# Patient Record
Sex: Male | Born: 2000 | Hispanic: Yes | Marital: Single | Smoking: Never smoker
Health system: Southern US, Community
[De-identification: ages and names within clinical notes are randomized; demographics above are authoritative.]

---
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
## 2022-07-14 ENCOUNTER — Ambulatory Visit (INDEPENDENT_AMBULATORY_CARE_PROVIDER_SITE_OTHER): Payer: Medicaid Other | Admitting: Pediatrics

## 2022-07-14 ENCOUNTER — Other Ambulatory Visit: Payer: Self-pay

## 2022-07-14 VITALS — HR 78 | Temp 98.2°F | Wt 209.8 lb

## 2022-07-14 DIAGNOSIS — J069 Acute upper respiratory infection, unspecified: Secondary | ICD-10-CM

## 2022-07-14 NOTE — Progress Notes (Addendum)
Subjective:    Matthew [name] is a 22 y.o. old male here by  himself for evaluation of 2 days of fever, cough, congestion, and sore throat.   Interpreter used during visit: No   HPI  Comes to clinic today for Cough (Cough, runny nose x 2 days.  Fever started Tuesday night, 102.5 this morning. ) .    Duration of chief complaint: 2 days  What have you tried? Tylenol  He reports that on Tuesday evening he started to have a cough and sore throat at home. He also had a fever that night. He felt somewhat better yesterday, but then felt worse again this morning and had a fever to 102F. He felt significantly better after taking some tylenol. He did not go to work today.  Works at [employer]. No known sick contacts. Did not get a Covid booster or annual flu shot this year.  No shortness of breath, chest pain, headache, ear pain, nausea, vomiting, diarrhea, rashes, or new medications. No recent abx.  Reports that he primarily came in because he had pneumonia in 2022 and wanted to make sure it was not happening again. He reports that he felt much more sick at that time.    Review of Systems  Constitutional:  Positive for chills and fever. Negative for activity change, appetite change and diaphoresis.  HENT:  Positive for congestion and sore throat. Negative for ear discharge, ear pain, hearing loss, mouth sores, sinus pressure, sinus pain and voice change.   Eyes:  Negative for pain, discharge and itching.  Respiratory:  Positive for cough. Negative for chest tightness, shortness of breath and wheezing.   Cardiovascular:  Negative for chest pain.  Gastrointestinal:  Negative for abdominal pain, constipation, diarrhea, nausea and vomiting.  Genitourinary:  Negative for difficulty urinating and dysuria.  Musculoskeletal:  Negative for back pain, myalgias, neck pain and neck stiffness.  Skin:  Negative for rash.  Neurological:  Negative for dizziness, syncope, numbness and headaches.   Psychiatric/Behavioral:  Negative for agitation, confusion and sleep disturbance.      History and Problem List: [place] has Overweight child on their problem list.  Jaleil  has no past medical history on file.      Objective:    Pulse 78   Temp 98.2 F (36.8 C) (Oral)   Wt 209 lb 12.8 oz (95.2 kg)   SpO2 100%   BMI 29.26 kg/m  Physical Exam Vitals and nursing note reviewed.  Constitutional:      General: He is not in acute distress.    Appearance: Normal appearance. He is not ill-appearing, toxic-appearing or diaphoretic.  HENT:     Head: Normocephalic and atraumatic.     Right Ear: Tympanic membrane, ear canal and external ear normal.     Left Ear: Tympanic membrane, ear canal and external ear normal.     Nose: Nose normal.     Mouth/Throat:     Mouth: Mucous membranes are moist.     Pharynx: Oropharynx is clear.  Eyes:     Extraocular Movements: Extraocular movements intact.     Conjunctiva/sclera: Conjunctivae normal.     Pupils: Pupils are equal, round, and reactive to light.  Cardiovascular:     Rate and Rhythm: Normal rate.     Pulses: Normal pulses.     Heart sounds: Normal heart sounds.  Pulmonary:     Effort: Pulmonary effort is normal.     Breath sounds: Normal breath sounds.  Abdominal:  General: Abdomen is flat. Bowel sounds are normal. There is no distension.     Palpations: Abdomen is soft.     Tenderness: There is no abdominal tenderness. There is no guarding or rebound.  Musculoskeletal:     Cervical back: Normal range of motion and neck supple.  Skin:    General: Skin is warm.     Capillary Refill: Capillary refill takes less than 2 seconds.     Findings: No rash.  Neurological:     General: No focal deficit present.     Mental Status: He is alert and oriented to person, place, and time.  Psychiatric:        Mood and Affect: Mood normal.        Behavior: Behavior normal.        Assessment and Plan:     Abid was seen today for  Cough (Cough, runny nose x 2 days.  Fever started Tuesday night, 102.5 this morning. ) .   Suspected Viral URI Presented with 2 days of fever, cough, congestion, and sore throat.  Very well appearing, afebrile, and had appropriate vitals for age in clinic. No signs or symptoms of localized bacterial infection. Physical exam reassuring with clear lungs, clear oropharynx, and benign abdomen. Appears well hydrated. Patient declined flu/Covid testing at this time. Suspect a non-specified viral URI as the most likely etiology of his symptoms.  -recommended supportive care and discussed return precautions -work note provided  -provided list of Adult Primary Care clinics in the area and encouraged transition to adult provider for next annual physical    Supportive care and return precautions reviewed.  Return if symptoms worsen or fail to improve.  Spent  20  minutes face to face time with patient; greater than 50% spent in counseling regarding diagnosis and treatment plan.  Hubbard Robinson, MD

## 2022-07-14 NOTE — Patient Instructions (Addendum)
It is likely that your symptoms are due to a viral upper respiratory infection. Your symptoms should improve on their own over the next few days--it is important to stay well hydrated during this time. You can use tylenol and/or ibuprofen to help with fevers, headaches, or other pains. You can also try some cough drops and warm salt water to gargle in the back of your throat and then spit out.   Please see a doctor if your fevers are still happening by Saturday/Sunday this weekend, if you start to have shortness of breath or chest pain, or start vomiting and are unable to eat or drink.   Below is a list of Adult Primary Care clinics in the area for you to look over as you are now at an age to transition to seeing an adult provider.   Adult Primary Care Clinics Name [facility]  Address: [place], [place] [address]  Phone: [phone] Hours: Monday - Friday 9 AM -6 PM  Types of insurance accepted:  Commercial insurance [employer] (orange card) [employer] Uninsured  Language services:  Video and phone interpreters available   Ages 58 and older    Adult primary care Onsite pharmacy Integrated behavioral health Financial assistance counseling Walk-in hours for established patients  Financial assistance counseling hours: Tuesdays 2:00PM - 5:00PM  Thursday 8:30AM - 4:30PM  Space is limited, 10 on Tuesday and 20 on Thursday. It's on first come first serve basis  Name [facility]  Address: [address] [place], [place] [address]  Phone: [phone]  Hours: Monday - Friday 8:30 AM - 5 PM  Types of insurance accepted:  Commercial insurance Medicaid Medicare Uninsured  Language services:  Video and phone interpreters available   All ages - newborn to adult   Primary care for all ages (children and adults) Integrated behavioral  health Nutritionist Financial assistance counseling   Name [facility] the ground floor of [facility]  Address: [address],  [place]  [address]  Phone: [phone]  Hours: Monday - Friday 8:15 AM - 5 PM  Types of insurance accepted:  Commercial insurance Medicaid Medicare Uninsured  Language services:  Video and phone interpreters available   Ages 11 and older   Adult primary care Nutritionist Certified Diabetes Educator  Integrated behavioral health Financial assistance counseling   Name [facility]  Address: [address] [place],  [address]  Phone: [phone]  Hours: Monday - Friday 8:30 AM - 5 PM    Types of insurance accepted:  Pharmacist, community Medicaid Medicare Uninsured  Language services:  Video and phone interpreters available   All ages - newborn to adult   Primary care for all ages (children and adults) Integrated behavioral health Financial assistance counseling

## 2022-07-15 ENCOUNTER — Encounter: Payer: Self-pay | Admitting: Pediatrics

## 2023-02-12 IMAGING — CR DG CHEST 2V
2 series · 2 of 2 positions shown · non-contrast
Comparison: Two-view chest x-ray 07/23/2014

CLINICAL DATA: Shortness of breath. Chest pain. Fever. Flu
positive.

EXAM:
CHEST - 2 VIEW

[w chest pa]
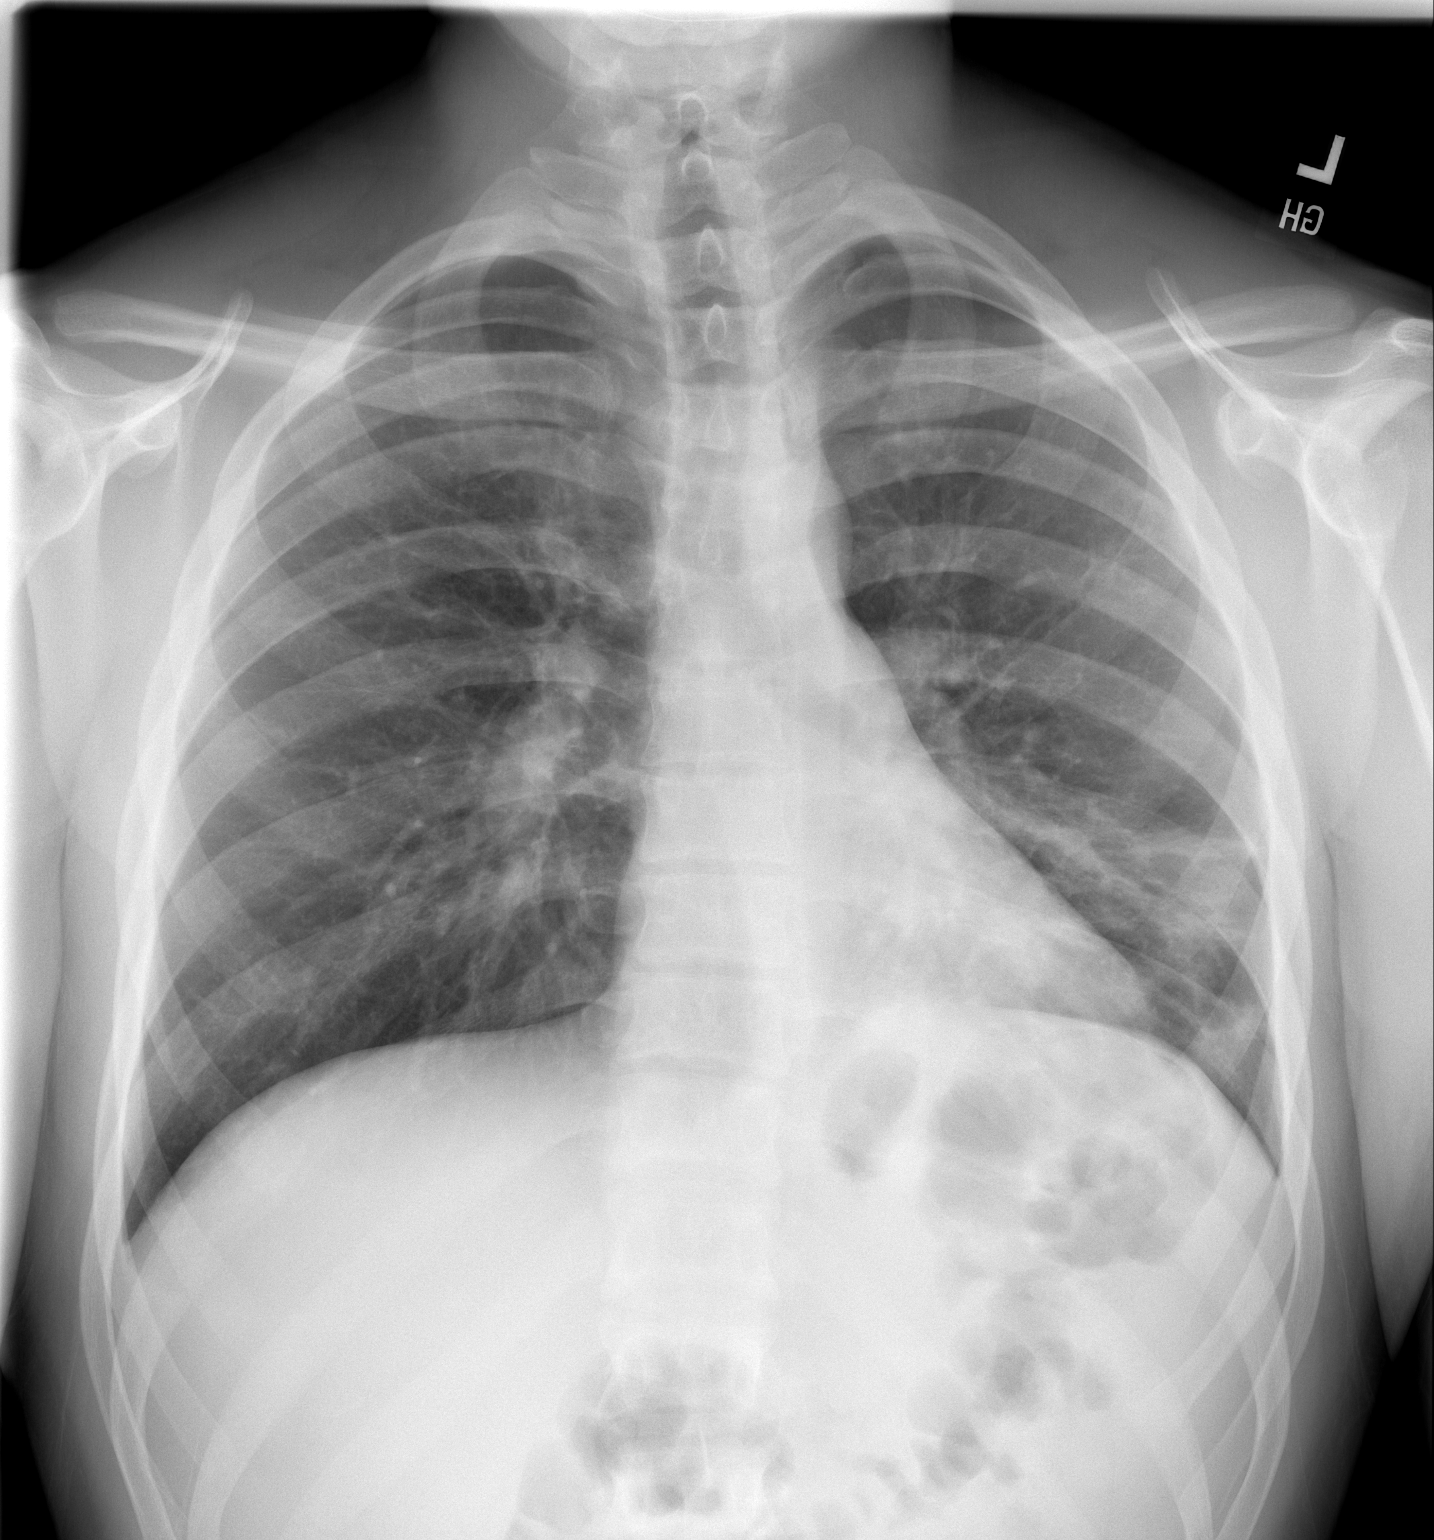

[w chest lat]
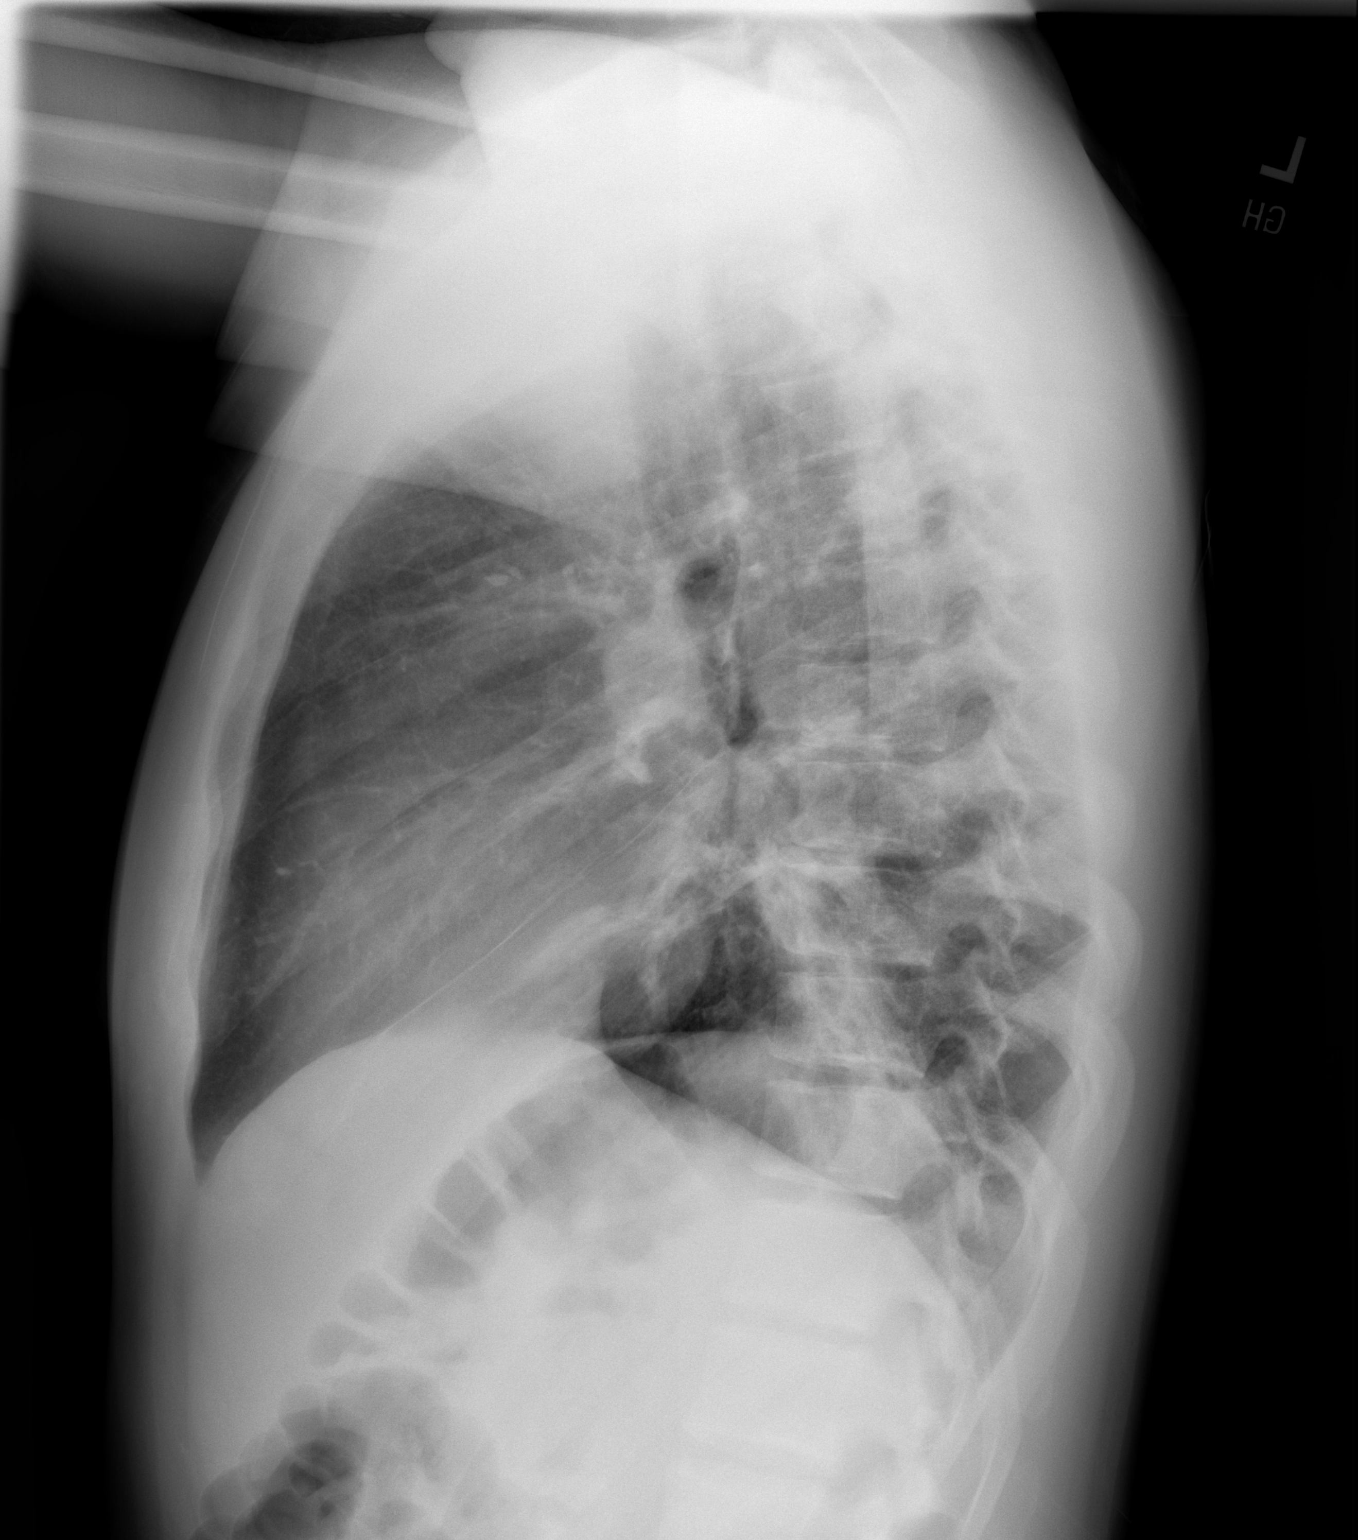

[2 of 2 positions shown; findings below may reference images not displayed]

FINDINGS: Heart size normal. New left lower lobe airspace disease is present.
Right lung and left upper lobe are clear. Axial skeleton is within
normal limits.
IMPRESSION: New left lower lobe airspace disease compatible with pneumonia.
